# Patient Record
Sex: Male | Born: 1945 | Race: Black or African American | Hispanic: No
Health system: Southern US, Community
[De-identification: ages and names within clinical notes are randomized; demographics above are authoritative.]

## PROBLEM LIST (undated history)
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
## 2003-03-04 ENCOUNTER — Encounter: Payer: Self-pay | Admitting: Emergency Medicine

## 2003-03-04 ENCOUNTER — Emergency Department (HOSPITAL_COMMUNITY): Admission: EM | Admit: 2003-03-04 | Discharge: 2003-03-04 | Payer: Self-pay | Admitting: Emergency Medicine

## 2003-06-07 ENCOUNTER — Encounter: Payer: Self-pay | Admitting: Emergency Medicine

## 2003-06-07 ENCOUNTER — Emergency Department (HOSPITAL_COMMUNITY): Admission: EM | Admit: 2003-06-07 | Discharge: 2003-06-07 | Payer: Self-pay | Admitting: Emergency Medicine

## 2005-09-01 ENCOUNTER — Emergency Department (HOSPITAL_COMMUNITY): Admission: EM | Admit: 2005-09-01 | Discharge: 2005-09-01 | Payer: Self-pay | Admitting: Emergency Medicine

## 2020-10-18 ENCOUNTER — Emergency Department (HOSPITAL_COMMUNITY): Payer: Medicare Other

## 2020-10-18 ENCOUNTER — Inpatient Hospital Stay (HOSPITAL_COMMUNITY)
Admission: EM | Admit: 2020-10-18 | Discharge: 2020-11-11 | DRG: 208 | Disposition: E | Payer: Medicare Other | Attending: Surgery | Admitting: Surgery

## 2020-10-18 ENCOUNTER — Encounter (HOSPITAL_COMMUNITY): Payer: Self-pay | Admitting: Radiology

## 2020-10-18 DIAGNOSIS — T1490XA Injury, unspecified, initial encounter: Secondary | ICD-10-CM

## 2020-10-18 DIAGNOSIS — J939 Pneumothorax, unspecified: Secondary | ICD-10-CM

## 2020-10-18 DIAGNOSIS — Z20822 Contact with and (suspected) exposure to covid-19: Secondary | ICD-10-CM | POA: Diagnosis present

## 2020-10-18 DIAGNOSIS — N179 Acute kidney failure, unspecified: Secondary | ICD-10-CM | POA: Diagnosis not present

## 2020-10-18 DIAGNOSIS — Z66 Do not resuscitate: Secondary | ICD-10-CM | POA: Diagnosis not present

## 2020-10-18 DIAGNOSIS — N472 Paraphimosis: Secondary | ICD-10-CM | POA: Diagnosis present

## 2020-10-18 DIAGNOSIS — I10 Essential (primary) hypertension: Secondary | ICD-10-CM | POA: Diagnosis present

## 2020-10-18 DIAGNOSIS — S272XXA Traumatic hemopneumothorax, initial encounter: Secondary | ICD-10-CM | POA: Diagnosis present

## 2020-10-18 DIAGNOSIS — J9601 Acute respiratory failure with hypoxia: Secondary | ICD-10-CM | POA: Diagnosis not present

## 2020-10-18 DIAGNOSIS — S27321A Contusion of lung, unilateral, initial encounter: Secondary | ICD-10-CM | POA: Diagnosis present

## 2020-10-18 DIAGNOSIS — F10239 Alcohol dependence with withdrawal, unspecified: Secondary | ICD-10-CM | POA: Diagnosis present

## 2020-10-18 DIAGNOSIS — Y906 Blood alcohol level of 120-199 mg/100 ml: Secondary | ICD-10-CM | POA: Diagnosis present

## 2020-10-18 DIAGNOSIS — Z9689 Presence of other specified functional implants: Secondary | ICD-10-CM

## 2020-10-18 DIAGNOSIS — T8383XA Hemorrhage of genitourinary prosthetic devices, implants and grafts, initial encounter: Secondary | ICD-10-CM | POA: Diagnosis not present

## 2020-10-18 DIAGNOSIS — T797XXA Traumatic subcutaneous emphysema, initial encounter: Secondary | ICD-10-CM | POA: Diagnosis present

## 2020-10-18 DIAGNOSIS — R338 Other retention of urine: Secondary | ICD-10-CM | POA: Diagnosis present

## 2020-10-18 DIAGNOSIS — R579 Shock, unspecified: Secondary | ICD-10-CM | POA: Diagnosis not present

## 2020-10-18 DIAGNOSIS — I469 Cardiac arrest, cause unspecified: Secondary | ICD-10-CM | POA: Diagnosis not present

## 2020-10-18 DIAGNOSIS — Z0189 Encounter for other specified special examinations: Secondary | ICD-10-CM

## 2020-10-18 DIAGNOSIS — Z781 Physical restraint status: Secondary | ICD-10-CM

## 2020-10-18 DIAGNOSIS — S2242XA Multiple fractures of ribs, left side, initial encounter for closed fracture: Secondary | ICD-10-CM | POA: Diagnosis present

## 2020-10-18 DIAGNOSIS — N320 Bladder-neck obstruction: Secondary | ICD-10-CM | POA: Diagnosis present

## 2020-10-18 DIAGNOSIS — N401 Enlarged prostate with lower urinary tract symptoms: Secondary | ICD-10-CM | POA: Diagnosis present

## 2020-10-18 DIAGNOSIS — E876 Hypokalemia: Secondary | ICD-10-CM | POA: Diagnosis present

## 2020-10-18 DIAGNOSIS — R310 Gross hematuria: Secondary | ICD-10-CM | POA: Diagnosis not present

## 2020-10-18 DIAGNOSIS — R739 Hyperglycemia, unspecified: Secondary | ICD-10-CM | POA: Diagnosis present

## 2020-10-18 DIAGNOSIS — Y92410 Unspecified street and highway as the place of occurrence of the external cause: Secondary | ICD-10-CM | POA: Diagnosis not present

## 2020-10-18 DIAGNOSIS — S270XXA Traumatic pneumothorax, initial encounter: Secondary | ICD-10-CM

## 2020-10-18 DIAGNOSIS — R0902 Hypoxemia: Secondary | ICD-10-CM | POA: Diagnosis not present

## 2020-10-18 DIAGNOSIS — K567 Ileus, unspecified: Secondary | ICD-10-CM | POA: Diagnosis not present

## 2020-10-18 DIAGNOSIS — Y738 Miscellaneous gastroenterology and urology devices associated with adverse incidents, not elsewhere classified: Secondary | ICD-10-CM | POA: Diagnosis not present

## 2020-10-18 DIAGNOSIS — Z938 Other artificial opening status: Secondary | ICD-10-CM

## 2020-10-18 DIAGNOSIS — Z4682 Encounter for fitting and adjustment of non-vascular catheter: Secondary | ICD-10-CM

## 2020-10-18 LAB — URINALYSIS, ROUTINE W REFLEX MICROSCOPIC
Bilirubin Urine: NEGATIVE
Glucose, UA: NEGATIVE mg/dL
Ketones, ur: NEGATIVE mg/dL
Leukocytes,Ua: NEGATIVE
Nitrite: NEGATIVE
Protein, ur: NEGATIVE mg/dL
Specific Gravity, Urine: >1.046 — ABNORMAL HIGH (ref 1.005–1.030)
pH: 5 (ref 5.0–8.0)

## 2020-10-18 LAB — I-STAT CHEM 8, ED
BUN: 7 mg/dL — ABNORMAL LOW (ref 8–23)
Calcium, Ion: 1.07 mmol/L — ABNORMAL LOW (ref 1.15–1.40)
Chloride: 103 mmol/L (ref 98–111)
Creatinine, Ser: 1.5 mg/dL — ABNORMAL HIGH (ref 0.61–1.24)
Glucose, Bld: 147 mg/dL — ABNORMAL HIGH (ref 70–99)
HCT: 39 % (ref 39.0–52.0)
Hemoglobin: 13.3 g/dL (ref 13.0–17.0)
Potassium: 2.9 mmol/L — ABNORMAL LOW (ref 3.5–5.1)
Sodium: 141 mmol/L (ref 135–145)
TCO2: 21 mmol/L — ABNORMAL LOW (ref 22–32)

## 2020-10-18 LAB — RAPID URINE DRUG SCREEN, HOSP PERFORMED
Amphetamines: NOT DETECTED
Barbiturates: NOT DETECTED
Benzodiazepines: NOT DETECTED
Cocaine: NOT DETECTED
Opiates: NOT DETECTED
Tetrahydrocannabinol: POSITIVE — AB

## 2020-10-18 LAB — RESPIRATORY PANEL BY RT PCR (FLU A&B, COVID)
Influenza A by PCR: NEGATIVE
Influenza B by PCR: NEGATIVE
SARS Coronavirus 2 by RT PCR: NEGATIVE

## 2020-10-18 LAB — GLUCOSE, CAPILLARY: Glucose-Capillary: 136 mg/dL — ABNORMAL HIGH (ref 70–99)

## 2020-10-18 LAB — COMPREHENSIVE METABOLIC PANEL
ALT: 31 U/L (ref 0–44)
AST: 47 U/L — ABNORMAL HIGH (ref 15–41)
Albumin: 3.2 g/dL — ABNORMAL LOW (ref 3.5–5.0)
Alkaline Phosphatase: 46 U/L (ref 38–126)
Anion gap: 15 (ref 5–15)
BUN: 7 mg/dL — ABNORMAL LOW (ref 8–23)
CO2: 19 mmol/L — ABNORMAL LOW (ref 22–32)
Calcium: 8.3 mg/dL — ABNORMAL LOW (ref 8.9–10.3)
Chloride: 104 mmol/L (ref 98–111)
Creatinine, Ser: 1.24 mg/dL (ref 0.61–1.24)
GFR, Estimated: >60 mL/min (ref >60)
Glucose, Bld: 155 mg/dL — ABNORMAL HIGH (ref 70–99)
Potassium: 2.9 mmol/L — ABNORMAL LOW (ref 3.5–5.1)
Sodium: 138 mmol/L (ref 135–145)
Total Bilirubin: 0.6 mg/dL (ref 0.3–1.2)
Total Protein: 5.7 g/dL — ABNORMAL LOW (ref 6.5–8.1)

## 2020-10-18 LAB — CBC
HCT: 40.3 % (ref 39.0–52.0)
Hemoglobin: 13.2 g/dL (ref 13.0–17.0)
MCH: 31.4 pg (ref 26.0–34.0)
MCHC: 32.8 g/dL (ref 30.0–36.0)
MCV: 96 fL (ref 80.0–100.0)
Platelets: 281 10*3/uL (ref 150–400)
RBC: 4.2 MIL/uL — ABNORMAL LOW (ref 4.22–5.81)
RDW: 12.5 % (ref 11.5–15.5)
WBC: 9.2 10*3/uL (ref 4.0–10.5)
nRBC: 0 % (ref 0.0–0.2)

## 2020-10-18 LAB — PROTIME-INR
INR: 1.1 (ref 0.8–1.2)
Prothrombin Time: 13.7 seconds (ref 11.4–15.2)

## 2020-10-18 LAB — SAMPLE TO BLOOD BANK

## 2020-10-18 LAB — LACTIC ACID, PLASMA: Lactic Acid, Venous: 5.6 mmol/L (ref 0.5–1.9)

## 2020-10-18 LAB — ETHANOL: Alcohol, Ethyl (B): 124 mg/dL — ABNORMAL HIGH (ref <10)

## 2020-10-18 MED ORDER — IOHEXOL 300 MG/ML  SOLN
100.0000 mL | Freq: Once | INTRAMUSCULAR | Status: AC | PRN
  Administered 2020-10-18: 100 mL via INTRAVENOUS

## 2020-10-18 MED ORDER — THIAMINE HCL 100 MG/ML IJ SOLN
100.0000 mg | Freq: Every day | INTRAMUSCULAR | Status: DC
  Administered 2020-10-21: 100 mg via INTRAVENOUS
  Filled 2020-10-18: qty 2

## 2020-10-18 MED ORDER — INSULIN ASPART 100 UNIT/ML ~~LOC~~ SOLN: 0.0000 [IU] | Freq: Every day | SUBCUTANEOUS | Status: DC

## 2020-10-18 MED ORDER — LACTATED RINGERS IV BOLUS
1000.0000 mL | Freq: Once | INTRAVENOUS | Status: AC
  Administered 2020-10-18: 1000 mL via INTRAVENOUS

## 2020-10-18 MED ORDER — OXYCODONE HCL 5 MG PO TABS
5.0000 mg | ORAL_TABLET | Freq: Four times a day (QID) | ORAL | Status: DC | PRN
  Administered 2020-10-19 (×2): 10 mg via ORAL
  Filled 2020-10-18 (×2): qty 2

## 2020-10-18 MED ORDER — HYDROMORPHONE HCL 1 MG/ML IJ SOLN
0.5000 mg | INTRAMUSCULAR | Status: DC | PRN
  Administered 2020-10-18 – 2020-10-19 (×3): 0.5 mg via INTRAVENOUS
  Filled 2020-10-18 (×3): qty 1

## 2020-10-18 MED ORDER — LORAZEPAM 1 MG PO TABS
1.0000 mg | ORAL_TABLET | ORAL | Status: DC | PRN
  Administered 2020-10-20 (×2): 2 mg via ORAL
  Filled 2020-10-18 (×2): qty 2

## 2020-10-18 MED ORDER — POTASSIUM CHLORIDE 10 MEQ/100ML IV SOLN
10.0000 meq | Freq: Once | INTRAVENOUS | Status: AC
  Administered 2020-10-18: 10 meq via INTRAVENOUS
  Filled 2020-10-18: qty 100

## 2020-10-18 MED ORDER — ONDANSETRON HCL 4 MG/2ML IJ SOLN
4.0000 mg | Freq: Four times a day (QID) | INTRAMUSCULAR | Status: DC | PRN
  Administered 2020-10-22: 4 mg via INTRAVENOUS
  Filled 2020-10-18: qty 2

## 2020-10-18 MED ORDER — ADULT MULTIVITAMIN W/MINERALS CH
1.0000 | ORAL_TABLET | Freq: Every day | ORAL | Status: DC
  Administered 2020-10-18 – 2020-10-20 (×3): 1 via ORAL
  Filled 2020-10-18 (×3): qty 1

## 2020-10-18 MED ORDER — HYDRALAZINE HCL 20 MG/ML IJ SOLN
10.0000 mg | INTRAMUSCULAR | Status: DC | PRN
  Administered 2020-10-19: 10 mg via INTRAVENOUS
  Filled 2020-10-18: qty 1

## 2020-10-18 MED ORDER — ENOXAPARIN SODIUM 30 MG/0.3ML ~~LOC~~ SOLN
30.0000 mg | Freq: Two times a day (BID) | SUBCUTANEOUS | Status: DC
  Administered 2020-10-19 – 2020-10-25 (×13): 30 mg via SUBCUTANEOUS
  Filled 2020-10-18 (×13): qty 0.3

## 2020-10-18 MED ORDER — DOCUSATE SODIUM 100 MG PO CAPS
100.0000 mg | ORAL_CAPSULE | Freq: Two times a day (BID) | ORAL | Status: DC
  Administered 2020-10-18 – 2020-10-20 (×5): 100 mg via ORAL
  Filled 2020-10-18 (×5): qty 1

## 2020-10-18 MED ORDER — LORAZEPAM 2 MG/ML IJ SOLN
1.0000 mg | INTRAMUSCULAR | Status: DC | PRN
  Administered 2020-10-19: 2 mg via INTRAVENOUS
  Administered 2020-10-20: 1 mg via INTRAVENOUS
  Administered 2020-10-20 – 2020-10-21 (×2): 2 mg via INTRAVENOUS
  Administered 2020-10-21: 1 mg via INTRAVENOUS
  Administered 2020-10-21 (×2): 4 mg via INTRAVENOUS
  Filled 2020-10-18 (×2): qty 1
  Filled 2020-10-18: qty 2
  Filled 2020-10-18 (×2): qty 1
  Filled 2020-10-18: qty 2
  Filled 2020-10-18 (×3): qty 1

## 2020-10-18 MED ORDER — THIAMINE HCL 100 MG PO TABS
100.0000 mg | ORAL_TABLET | Freq: Every day | ORAL | Status: DC
  Administered 2020-10-18 – 2020-10-20 (×3): 100 mg via ORAL
  Filled 2020-10-18 (×4): qty 1

## 2020-10-18 MED ORDER — ACETAMINOPHEN 325 MG PO TABS
650.0000 mg | ORAL_TABLET | Freq: Four times a day (QID) | ORAL | Status: DC
  Administered 2020-10-18 – 2020-10-20 (×7): 650 mg via ORAL
  Filled 2020-10-18 (×7): qty 2

## 2020-10-18 MED ORDER — FOLIC ACID 1 MG PO TABS
1.0000 mg | ORAL_TABLET | Freq: Every day | ORAL | Status: DC
  Administered 2020-10-18 – 2020-10-21 (×4): 1 mg via ORAL
  Filled 2020-10-18 (×5): qty 1

## 2020-10-18 MED ORDER — FENTANYL CITRATE (PF) 100 MCG/2ML IJ SOLN
INTRAMUSCULAR | Status: AC
  Filled 2020-10-18: qty 2

## 2020-10-18 MED ORDER — IOHEXOL 350 MG/ML SOLN
80.0000 mL | Freq: Once | INTRAVENOUS | Status: AC | PRN
  Administered 2020-10-18: 80 mL via INTRAVENOUS

## 2020-10-18 MED ORDER — LACTATED RINGERS IV SOLN: INTRAVENOUS | Status: DC

## 2020-10-18 MED ORDER — INSULIN ASPART 100 UNIT/ML ~~LOC~~ SOLN: 0.0000 [IU] | Freq: Three times a day (TID) | SUBCUTANEOUS | Status: DC

## 2020-10-18 MED ORDER — FENTANYL CITRATE (PF) 100 MCG/2ML IJ SOLN
INTRAMUSCULAR | Status: AC | PRN
Start: 2020-10-18 — End: 2020-10-18
  Administered 2020-10-18: 100 ug via INTRAVENOUS

## 2020-10-18 MED ORDER — ONDANSETRON 4 MG PO TBDP: 4.0000 mg | ORAL_TABLET | Freq: Four times a day (QID) | ORAL | Status: DC | PRN

## 2020-10-18 NOTE — ED Notes (Signed)
Pt oxygen desaturates with any movement. Pt repositioned in bed with slow improvement in oxygen saturation up to 93% from 87%.
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
## 2020-10-18 NOTE — ED Notes (Signed)
To CT NOW 

## 2020-10-18 NOTE — ED Notes (Signed)
Pt rolled over onto L side. Pt placed onto his back in the stretcher. Chest tube assessed, no kinks or air leaks noted. Tube appears secure.
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
## 2020-10-18 NOTE — ED Notes (Signed)
TRN note: Pt requesting we call Eula Fried, friend at [phone]. Friend notified by this RN

## 2020-10-18 NOTE — ED Notes (Signed)
TRN note:  Called to ED by primary RN to evaluate increasing SQ air in L chest. L side of chest does appear to have increased in size since arrival.  Chest tube remains in place, all bandages intact, active bright red drainage in tubing, @ 70cc bright red blood in saraha drainage device.  Bubbles can be produced with movement of tubing, pt reports he can not cough d/t pain. VSS. GSC 15. Will continue to monitor.  Dr. Cliffton Asters notified of concerns, no further orders at this time.

## 2020-10-18 NOTE — Progress Notes (Signed)
CT Chest demonstrated numerous left sided rib fractures with associated significant left pneumothorax and extensive subQ emphysema  Discussed this with the patient. He is having increasing dyspnea  We discussed emergent placement of left sided chest tube. We discussed percutaneous access of left chest cavity, placement of pigtail catheter into the chest to re-expand lung. We reviewed rational. We discussed risks of injury to nerves, blood vessels, lung, heart, intra-abdominal organs, infectious complications, need for additional tubes to be placed. He agreed to proceed

## 2020-10-18 NOTE — ED Notes (Signed)
C-collar applied

## 2020-10-18 NOTE — ED Notes (Signed)
Attempted report x1. 

## 2020-10-18 NOTE — ED Notes (Signed)
Pt to CT with TRN  

## 2020-10-18 NOTE — ED Notes (Signed)
Md notified of worsening left pectoral edema

## 2020-10-18 NOTE — ED Notes (Signed)
L sided chest tube placed by Dr. Cliffton Asters. Blood return noted.

## 2020-10-18 NOTE — Procedures (Signed)
Pre-op Diagnosis:  Traumatic left pneumothorax Post-op Diagnosis:  Same Procedure:   Placement of pigtail chest tube, left Surgeon: Marin Olp, MD Anesthesia:  Local   Description of procedure:  The patient was supine on the stretcher. The patient was identified and a timeout was held. The left chest prepped with Chloraprep and draped in sterile fashion.  1% lidocaine was infiltrated in the skin and subcutaneous tissue. Left anterior axillary line just lateral and cephalad to the nipple, the rib spaces were found. The pleural space was then accessed just above the rib with an access needle aiming cephalad. Intrapleural location was confirmed by aspiration of air.  The guidewire was advanced and the needle was removed.  A small incision was made in the skin. The dilator passed over the wire into the pleural space.  The dilator was removed and the catheter was directed over the wire after placing the straightener.  The straightener was removed, forming the pigtail.  The tube was connected to 20 cm H2O suction and secured with 0 silk.  A sterile dressing was applied. He tolerated the procedure well.

## 2020-10-18 NOTE — ED Notes (Signed)
Friend at bedside, updated on plan of care.
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
## 2020-10-19 ENCOUNTER — Inpatient Hospital Stay (HOSPITAL_COMMUNITY): Payer: Medicare Other

## 2020-10-19 LAB — COMPREHENSIVE METABOLIC PANEL
ALT: 42 U/L (ref 0–44)
AST: 83 U/L — ABNORMAL HIGH (ref 15–41)
Albumin: 3.2 g/dL — ABNORMAL LOW (ref 3.5–5.0)
Alkaline Phosphatase: 46 U/L (ref 38–126)
Anion gap: 11 (ref 5–15)
BUN: 9 mg/dL (ref 8–23)
CO2: 23 mmol/L (ref 22–32)
Calcium: 8.6 mg/dL — ABNORMAL LOW (ref 8.9–10.3)
Chloride: 105 mmol/L (ref 98–111)
Creatinine, Ser: 1.1 mg/dL (ref 0.61–1.24)
GFR, Estimated: >60 mL/min (ref >60)
Glucose, Bld: 141 mg/dL — ABNORMAL HIGH (ref 70–99)
Potassium: 4.2 mmol/L (ref 3.5–5.1)
Sodium: 139 mmol/L (ref 135–145)
Total Bilirubin: 0.8 mg/dL (ref 0.3–1.2)
Total Protein: 6.2 g/dL — ABNORMAL LOW (ref 6.5–8.1)

## 2020-10-19 LAB — GLUCOSE, CAPILLARY
Glucose-Capillary: 126 mg/dL — ABNORMAL HIGH (ref 70–99)
Glucose-Capillary: 127 mg/dL — ABNORMAL HIGH (ref 70–99)
Glucose-Capillary: 134 mg/dL — ABNORMAL HIGH (ref 70–99)
Glucose-Capillary: 116 mg/dL — ABNORMAL HIGH (ref 70–99)
Glucose-Capillary: 119 mg/dL — ABNORMAL HIGH (ref 70–99)

## 2020-10-19 LAB — CBC
HCT: 39.5 % (ref 39.0–52.0)
Hemoglobin: 13.5 g/dL (ref 13.0–17.0)
MCH: 32 pg (ref 26.0–34.0)
MCHC: 34.2 g/dL (ref 30.0–36.0)
MCV: 93.6 fL (ref 80.0–100.0)
Platelets: 215 10*3/uL (ref 150–400)
RBC: 4.22 MIL/uL (ref 4.22–5.81)
RDW: 12.7 % (ref 11.5–15.5)
WBC: 10.1 10*3/uL (ref 4.0–10.5)
nRBC: 0 % (ref 0.0–0.2)

## 2020-10-19 LAB — TRAUMA TEG PANEL
CFF Max Amplitude: 19.9 mm (ref 15–32)
Citrated Kaolin (R): 5.7 min (ref 4.6–9.1)
Citrated Rapid TEG (MA): 59.3 mm (ref 52–70)
Lysis at 30 Minutes: 1.4 % (ref 0.0–2.6)

## 2020-10-19 LAB — PHOSPHORUS: Phosphorus: 3.6 mg/dL (ref 2.5–4.6)

## 2020-10-19 LAB — HEMOGLOBIN A1C
Hgb A1c MFr Bld: 5.5 % (ref 4.8–5.6)
Mean Plasma Glucose: 111.15 mg/dL

## 2020-10-19 LAB — MAGNESIUM: Magnesium: 1.7 mg/dL (ref 1.7–2.4)

## 2020-10-19 MED ORDER — METOPROLOL TARTRATE 50 MG PO TABS
50.0000 mg | ORAL_TABLET | Freq: Two times a day (BID) | ORAL | Status: DC
  Administered 2020-10-19 – 2020-10-20 (×3): 50 mg via ORAL
  Filled 2020-10-19 (×3): qty 1

## 2020-10-19 MED ORDER — TAMSULOSIN HCL 0.4 MG PO CAPS: 0.4000 mg | ORAL_CAPSULE | Freq: Every day | ORAL | Status: DC

## 2020-10-19 MED ORDER — TAMSULOSIN HCL 0.4 MG PO CAPS
0.4000 mg | ORAL_CAPSULE | Freq: Every day | ORAL | Status: DC
  Administered 2020-10-19 – 2020-10-20 (×2): 0.4 mg via ORAL
  Filled 2020-10-19 (×2): qty 1

## 2020-10-19 MED ORDER — HYDRALAZINE HCL 20 MG/ML IJ SOLN: 10.0000 mg | INTRAMUSCULAR | Status: DC | PRN

## 2020-10-19 MED ORDER — IPRATROPIUM-ALBUTEROL 0.5-2.5 (3) MG/3ML IN SOLN
3.0000 mL | Freq: Four times a day (QID) | RESPIRATORY_TRACT | Status: DC
  Administered 2020-10-19 – 2020-10-20 (×3): 3 mL via RESPIRATORY_TRACT
  Filled 2020-10-19 (×3): qty 3

## 2020-10-19 MED ORDER — SPIRITUS FRUMENTI
1.0000 | Freq: Two times a day (BID) | ORAL | Status: DC
  Administered 2020-10-19 – 2020-10-20 (×2): 1 via ORAL
  Filled 2020-10-19 (×4): qty 1

## 2020-10-19 NOTE — TOC CAGE-AID Note (Signed)
Transition of Care [facility]) - CAGE-AID Screening   Patient Details  Name: Ernest Lawrence MRN: [id number] Date of Birth: [birth year]  [facility]) CM/SW Contact:    Emeterio Reeve, [place] Phone Number: 10/19/2020, 1:52 PM   Clinical Narrative:  CSW met with pt at bedside. CSW introduced self and explained her role at the hospital.  Pt reports occasional substance use and marijuana use. Pt denied resources.   CAGE-AID Screening:    Have You Ever Felt You Ought to Cut Down on Your Drinking or Drug Use?: No Have People Annoyed You By Critizing Your Drinking Or Drug Use?: No Have You Felt Bad Or Guilty About Your Drinking Or Drug Use?: No Have You Ever Had a Drink or Used Drugs First Thing In The Morning to Steady Your Nerves or to Get Rid of a Hangover?: No CAGE-AID Score: 0  Substance Abuse Education Offered: Yes     Blima Ledger, [place] Social Worker [phone]
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
## 2020-10-20 ENCOUNTER — Inpatient Hospital Stay (HOSPITAL_COMMUNITY): Payer: Medicare Other

## 2020-10-20 ENCOUNTER — Other Ambulatory Visit: Payer: Self-pay

## 2020-10-20 LAB — BLOOD GAS, ARTERIAL
Acid-Base Excess: 2.6 mmol/L — ABNORMAL HIGH (ref 0.0–2.0)
Bicarbonate: 26.6 mmol/L (ref 20.0–28.0)
FIO2: 36
O2 Saturation: 93.4 %
Patient temperature: 37.8
pCO2 arterial: 42.4 mmHg (ref 32.0–48.0)
pH, Arterial: 7.418 (ref 7.350–7.450)
pO2, Arterial: 70.9 mmHg — ABNORMAL LOW (ref 83.0–108.0)

## 2020-10-20 LAB — CBC
HCT: 40.9 % (ref 39.0–52.0)
Hemoglobin: 13.8 g/dL (ref 13.0–17.0)
MCH: 31.2 pg (ref 26.0–34.0)
MCHC: 33.7 g/dL (ref 30.0–36.0)
MCV: 92.5 fL (ref 80.0–100.0)
Platelets: 188 10*3/uL (ref 150–400)
RBC: 4.42 MIL/uL (ref 4.22–5.81)
RDW: 12.7 % (ref 11.5–15.5)
WBC: 11.4 10*3/uL — ABNORMAL HIGH (ref 4.0–10.5)
nRBC: 0 % (ref 0.0–0.2)

## 2020-10-20 LAB — BASIC METABOLIC PANEL
Anion gap: 9 (ref 5–15)
BUN: 9 mg/dL (ref 8–23)
CO2: 26 mmol/L (ref 22–32)
Calcium: 9.2 mg/dL (ref 8.9–10.3)
Chloride: 106 mmol/L (ref 98–111)
Creatinine, Ser: 0.9 mg/dL (ref 0.61–1.24)
GFR, Estimated: >60 mL/min (ref >60)
Glucose, Bld: 136 mg/dL — ABNORMAL HIGH (ref 70–99)
Potassium: 3.9 mmol/L (ref 3.5–5.1)
Sodium: 141 mmol/L (ref 135–145)

## 2020-10-20 LAB — PSA: Prostatic Specific Antigen: 10.17 ng/mL — ABNORMAL HIGH (ref 0.00–4.00)

## 2020-10-20 LAB — GLUCOSE, CAPILLARY
Glucose-Capillary: 107 mg/dL — ABNORMAL HIGH (ref 70–99)
Glucose-Capillary: 108 mg/dL — ABNORMAL HIGH (ref 70–99)
Glucose-Capillary: 128 mg/dL — ABNORMAL HIGH (ref 70–99)
Glucose-Capillary: 128 mg/dL — ABNORMAL HIGH (ref 70–99)
Glucose-Capillary: 140 mg/dL — ABNORMAL HIGH (ref 70–99)

## 2020-10-20 LAB — TROPONIN I (HIGH SENSITIVITY): Troponin I (High Sensitivity): 48 ng/L — ABNORMAL HIGH (ref <18)

## 2020-10-20 LAB — AMMONIA: Ammonia: 39 umol/L — ABNORMAL HIGH (ref 9–35)

## 2020-10-20 MED ORDER — ALBUTEROL SULFATE (2.5 MG/3ML) 0.083% IN NEBU: 2.5000 mg | INHALATION_SOLUTION | RESPIRATORY_TRACT | Status: DC | PRN

## 2020-10-20 MED ORDER — METOPROLOL TARTRATE 50 MG PO TABS
100.0000 mg | ORAL_TABLET | Freq: Two times a day (BID) | ORAL | Status: DC
  Administered 2020-10-20: 100 mg via ORAL
  Filled 2020-10-20: qty 2

## 2020-10-20 MED ORDER — IPRATROPIUM-ALBUTEROL 0.5-2.5 (3) MG/3ML IN SOLN
3.0000 mL | Freq: Two times a day (BID) | RESPIRATORY_TRACT | Status: DC
  Administered 2020-10-20 – 2020-10-22 (×2): 3 mL via RESPIRATORY_TRACT
  Filled 2020-10-20 (×5): qty 3

## 2020-10-20 MED ORDER — LACTULOSE 10 GM/15ML PO SOLN
20.0000 g | Freq: Once | ORAL | Status: AC
  Administered 2020-10-20: 20 g via ORAL
  Filled 2020-10-20: qty 30

## 2020-10-20 MED ORDER — FENTANYL CITRATE (PF) 100 MCG/2ML IJ SOLN
25.0000 ug | Freq: Once | INTRAMUSCULAR | Status: AC
  Administered 2020-10-20: 25 ug via INTRAVENOUS

## 2020-10-20 MED ORDER — FENTANYL CITRATE (PF) 100 MCG/2ML IJ SOLN
50.0000 ug | Freq: Once | INTRAMUSCULAR | Status: AC
  Administered 2020-10-20: 50 ug via INTRAVENOUS
  Filled 2020-10-20: qty 2

## 2020-10-20 MED ORDER — POLYETHYLENE GLYCOL 3350 17 G PO PACK
17.0000 g | PACK | Freq: Every day | ORAL | Status: DC
  Administered 2020-10-20: 17 g via ORAL

## 2020-10-20 MED ORDER — IPRATROPIUM-ALBUTEROL 0.5-2.5 (3) MG/3ML IN SOLN
3.0000 mL | Freq: Three times a day (TID) | RESPIRATORY_TRACT | Status: DC
  Filled 2020-10-20: qty 3

## 2020-10-20 NOTE — Progress Notes (Addendum)
2312 - Upon entering the room the pt was attempting to exit the bed. The pt chewed through his R wrist restraint, removed his IV and chest tube. This RN applied vaseline gauze and occlusive dressing to chest tube site. Pts O2 sats are >95. This RN noted diminished breath sounds and crepitus in L upper lung. Pt appears to be in no distress. On call provider notified. Portable chest x-ray ordered. 80 ml of output noted from chest tube. Bilateral wrist restraints applied. Bilateral hand mittens applied. Will continue to monitor.

## 2020-10-20 NOTE — Progress Notes (Signed)
SLP Cancellation Note  Patient Details Name: Ernest Lawrence MRN: [id number] DOB: 1946/01/18   Cancelled eval:     Attempted cognitive evaluation earlier in the day.  Pt groggy, adamant in his refusal to participate despite various attempts.  Will continue efforts.  Ernest Spikes L. Samson Frederic, MA CCC/SLP Acute Rehabilitation Services Office number [phone] Pager [phone]      Ernest Lawrence 10/20/2020, 1:26 PM
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
## 2020-10-20 NOTE — Procedures (Signed)
Insertion of Chest Tube Procedure Note  Ernest Lawrence  [id number]  [birth year]  Date:10/20/20  Time:4:29 PM    Provider Performing: Adam Phenix   Procedure: Chest Tube Insertion [phone])  Indication(s) Hemothorax  Consent Risks of the procedure as well as the alternatives and risks of each were explained to the patient and/or caregiver.  Consent for the procedure was obtained and is signed in the bedside chart  Anesthesia Topical only with 1% lidocaine  75 mcg Fentanyl   Time Out Verified patient identification, verified procedure, site/side was marked, verified correct patient position, special equipment/implants available, medications/allergies/relevant history reviewed, required imaging and test results available.   Sterile Technique Maximal sterile technique including full sterile barrier drape, hand hygiene, sterile gown, sterile gloves, mask, hair covering, sterile ultrasound probe cover (if used).   Procedure Description Ultrasound not used to identify appropriate pleural anatomy for placement and overlying skin marked. Area of placement cleaned and draped in sterile fashion.  A 14 French pigtail pleural catheter was placed into the left pleural space using Seldinger technique. Appropriate return of fluid was obtained.  The tube was connected to atrium and placed on -20 cm H2O wall suction. The previous chest tube site was closed with one simple interrupted suture.  Complications/Tolerance None; patient tolerated the procedure well. Chest X-ray is ordered to verify placement.  EBL Minimal  Specimen(s) none   Ernest Lawrence, [facility] General & Trauma Surgery
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
## 2020-10-20 NOTE — Progress Notes (Signed)
This RN noticed that monitor noted STE. STEMI noted on 12 L EKG. Pt does not complain of chest pain, pt SOB and diaphoretic. Abnormal breath and cardiac sounds noted. Rapid response and on call provider notified. On call provider ordered a troponin and blood gas.

## 2020-10-20 NOTE — Significant Event (Signed)
Rapid Response Event Note   Reason for Call :  STEMI on 12 L EKG.  RN noticed monitor saying "STE" and so an EKG was obtained. EKG showed "ST, ST elevation, consider inferolateral injury or acute infarct ACUTE MI/STEMI." Additional EKG was obtained showing "NSR, possible acute pericarditis."  Initial Focused Assessment:  Pt laying in bed, diaphoretic, SOB. Pt c/o L sided chest pain around his chest tube site. Crackles felt over L chest wall. R lung with rhonchi. L lung with rhonchi/rales t/o. There is what sounds like a pleural friction rub on the L side and possibly a pericardial friction rub as well-this is not very loud and it's hard to hear over lung sounds. Skin warm and diaphoretic. L CT to -40 sx with no air leak, draining serosang drainage. HR-99, BP-180/97, RR-43, SpO2-93% on 4L [place].    Interventions:  EKG-see above for interpretation Trop ABG Plan of Care:  Per RN, SOB is not new. Obtain trop, ABG. Await results. Continue to monitor pt. Call RRT if further assistance needed.   Event Summary:   MD Notified: Dr. Bedelia Person at [phone] Call Time:0032 Arrival Time:0039 End Time:0103  Terrilyn Saver, RN
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
## 2020-10-21 ENCOUNTER — Inpatient Hospital Stay (HOSPITAL_COMMUNITY): Payer: Medicare Other

## 2020-10-21 LAB — BASIC METABOLIC PANEL
Anion gap: 10 (ref 5–15)
BUN: 15 mg/dL (ref 8–23)
CO2: 28 mmol/L (ref 22–32)
Calcium: 9.6 mg/dL (ref 8.9–10.3)
Chloride: 105 mmol/L (ref 98–111)
Creatinine, Ser: 1.06 mg/dL (ref 0.61–1.24)
GFR, Estimated: >60 mL/min (ref >60)
Glucose, Bld: 132 mg/dL — ABNORMAL HIGH (ref 70–99)
Potassium: 3.8 mmol/L (ref 3.5–5.1)
Sodium: 143 mmol/L (ref 135–145)

## 2020-10-21 LAB — BLOOD GAS, ARTERIAL
Acid-Base Excess: 4.4 mmol/L — ABNORMAL HIGH (ref 0.0–2.0)
Bicarbonate: 28.1 mmol/L — ABNORMAL HIGH (ref 20.0–28.0)
Drawn by: 519031
FIO2: 40
O2 Saturation: 94 %
Patient temperature: 37.5
pCO2 arterial: 40.5 mmHg (ref 32.0–48.0)
pH, Arterial: 7.457 — ABNORMAL HIGH (ref 7.350–7.450)
pO2, Arterial: 70.9 mmHg — ABNORMAL LOW (ref 83.0–108.0)

## 2020-10-21 LAB — CBC
HCT: 41 % (ref 39.0–52.0)
Hemoglobin: 14 g/dL (ref 13.0–17.0)
MCH: 32 pg (ref 26.0–34.0)
MCHC: 34.1 g/dL (ref 30.0–36.0)
MCV: 93.6 fL (ref 80.0–100.0)
Platelets: 223 10*3/uL (ref 150–400)
RBC: 4.38 MIL/uL (ref 4.22–5.81)
RDW: 12.7 % (ref 11.5–15.5)
WBC: 10.6 10*3/uL — ABNORMAL HIGH (ref 4.0–10.5)
nRBC: 0 % (ref 0.0–0.2)

## 2020-10-21 LAB — GLUCOSE, CAPILLARY
Glucose-Capillary: 115 mg/dL — ABNORMAL HIGH (ref 70–99)
Glucose-Capillary: 124 mg/dL — ABNORMAL HIGH (ref 70–99)
Glucose-Capillary: 111 mg/dL — ABNORMAL HIGH (ref 70–99)

## 2020-10-21 LAB — AMMONIA: Ammonia: 37 umol/L — ABNORMAL HIGH (ref 9–35)

## 2020-10-21 MED ORDER — LACTULOSE 10 GM/15ML PO SOLN
20.0000 g | Freq: Once | ORAL | Status: AC
  Administered 2020-10-21: 20 g
  Filled 2020-10-21: qty 30

## 2020-10-21 MED ORDER — POTASSIUM CHLORIDE IN NACL 20-0.45 MEQ/L-% IV SOLN
INTRAVENOUS | Status: DC
  Filled 2020-10-21 (×7): qty 1000

## 2020-10-21 MED ORDER — PANTOPRAZOLE SODIUM 40 MG IV SOLR
40.0000 mg | Freq: Once | INTRAVENOUS | Status: AC
  Administered 2020-10-21: 40 mg via INTRAVENOUS
  Filled 2020-10-21: qty 40

## 2020-10-21 MED ORDER — CHLORHEXIDINE GLUCONATE CLOTH 2 % EX PADS
6.0000 | MEDICATED_PAD | Freq: Every day | CUTANEOUS | Status: DC
  Administered 2020-10-21 – 2020-10-25 (×5): 6 via TOPICAL

## 2020-10-21 MED ORDER — METOPROLOL TARTRATE 5 MG/5ML IV SOLN
5.0000 mg | Freq: Three times a day (TID) | INTRAVENOUS | Status: DC
  Administered 2020-10-21 – 2020-10-22 (×3): 5 mg via INTRAVENOUS
  Filled 2020-10-21 (×3): qty 5

## 2020-10-21 MED ORDER — MORPHINE SULFATE (PF) 2 MG/ML IV SOLN
2.0000 mg | INTRAVENOUS | Status: DC | PRN
  Administered 2020-10-21 – 2020-10-23 (×5): 2 mg via INTRAVENOUS
  Filled 2020-10-21 (×5): qty 1

## 2020-10-21 MED ORDER — BETHANECHOL CHLORIDE 25 MG PO TABS
25.0000 mg | ORAL_TABLET | Freq: Three times a day (TID) | ORAL | Status: DC
  Administered 2020-10-21 – 2020-10-23 (×8): 25 mg
  Filled 2020-10-21 (×8): qty 1

## 2020-10-21 MED ORDER — LORAZEPAM 2 MG/ML IJ SOLN
1.0000 mg | INTRAMUSCULAR | Status: DC | PRN
  Administered 2020-10-21 – 2020-10-22 (×5): 2 mg via INTRAVENOUS
  Filled 2020-10-21 (×5): qty 1

## 2020-10-21 MED ORDER — FUROSEMIDE 10 MG/ML IJ SOLN
40.0000 mg | Freq: Once | INTRAMUSCULAR | Status: AC
  Administered 2020-10-21: 40 mg via INTRAVENOUS
  Filled 2020-10-21: qty 4

## 2020-10-21 MED ORDER — LORAZEPAM 1 MG PO TABS: 1.0000 mg | ORAL_TABLET | ORAL | Status: DC | PRN

## 2020-10-21 MED ORDER — HALOPERIDOL LACTATE 5 MG/ML IJ SOLN: 5.0000 mg | Freq: Four times a day (QID) | INTRAMUSCULAR | Status: DC | PRN

## 2020-10-21 MED ORDER — LIDOCAINE HCL (PF) 1 % IJ SOLN
INTRAMUSCULAR | Status: AC
  Filled 2020-10-21: qty 5

## 2020-10-21 MED ORDER — LIDOCAINE HCL (PF) 1 % IJ SOLN
INTRAMUSCULAR | Status: AC
  Filled 2020-10-21: qty 30
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
## 2020-10-21 NOTE — Progress Notes (Signed)
RT NOTES: ABG obtained and sent to lab. Lab tech notified.  
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
## 2020-10-21 NOTE — Procedures (Signed)
Insertion of Chest Tube Procedure Note  Ernest Lawrence  [id number]  [birth year]  Date:10/21/20  Time:9:31 AM    Provider Performing: Ernest Mouton, MD  Procedure: Chest Tube Insertion (25053)  Indication(s) Pneumothorax  Consent Unable to obtain consent due to emergent nature of procedure.  Anesthesia Topical only with 1% lidocaine    Time Out Verified patient identification, verified procedure, site/side was marked, verified correct patient position, special equipment/implants available, medications/allergies/relevant history reviewed, required imaging and test results available.   Sterile Technique Maximal sterile technique including full sterile barrier drape, hand hygiene, sterile gown, sterile gloves, mask, hair covering, sterile ultrasound probe cover (if used).   Procedure Description Ultrasound not used to identify appropriate pleural anatomy for placement and overlying skin marked. Area of placement cleaned and draped in sterile fashion.  A 20 French chest tube was placed into the left pleural space using Ernest Lawrence dissection. Appropriate return of air and fluid was obtained.  The tube was connected to atrium and placed on -20 cm H2O wall suction.   Complications/Tolerance None; patient tolerated the procedure well. Chest X-ray is ordered to verify placement.   EBL Minimal  Specimen(s) none   Ernest Lawrence, [facility] Surgery 10/21/2020, 9:32 AM Please see Amion for pager number during day hours 7:00am-4:30pm

## 2020-10-21 NOTE — Progress Notes (Signed)
PT Cancellation Note  Patient Details Name: Ernest Lawrence MRN: [id number] DOB: 07/08/1946   Cancelled Treatment:    Reason Eval/Treat Not Completed: Other (comment). Pt very agitated now in restraints and mittens. Pt pulled out chest tube. Pt just had another chest tube placed. Per PA they pulled 600cc out of NG tube and pt now has an ileus. Acute PT to return as able as appropriate.  Lewis Shock, PT, DPT Acute Rehabilitation Services Pager #: [phone] Office #: [phone]    Iona Hansen 10/21/2020, 10:22 AM
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
## 2020-10-22 ENCOUNTER — Inpatient Hospital Stay (HOSPITAL_COMMUNITY): Payer: Medicare Other

## 2020-10-22 LAB — BASIC METABOLIC PANEL
Anion gap: 10 (ref 5–15)
BUN: 27 mg/dL — ABNORMAL HIGH (ref 8–23)
CO2: 28 mmol/L (ref 22–32)
Calcium: 8.7 mg/dL — ABNORMAL LOW (ref 8.9–10.3)
Chloride: 107 mmol/L (ref 98–111)
Creatinine, Ser: 1.28 mg/dL — ABNORMAL HIGH (ref 0.61–1.24)
GFR, Estimated: 60 mL/min — ABNORMAL LOW (ref >60)
Glucose, Bld: 128 mg/dL — ABNORMAL HIGH (ref 70–99)
Potassium: 3.8 mmol/L (ref 3.5–5.1)
Sodium: 145 mmol/L (ref 135–145)

## 2020-10-22 LAB — CBC
HCT: 38.4 % — ABNORMAL LOW (ref 39.0–52.0)
Hemoglobin: 13 g/dL (ref 13.0–17.0)
MCH: 31.6 pg (ref 26.0–34.0)
MCHC: 33.9 g/dL (ref 30.0–36.0)
MCV: 93.4 fL (ref 80.0–100.0)
Platelets: 224 10*3/uL (ref 150–400)
RBC: 4.11 MIL/uL — ABNORMAL LOW (ref 4.22–5.81)
RDW: 13.1 % (ref 11.5–15.5)
WBC: 7.5 10*3/uL (ref 4.0–10.5)
nRBC: 0 % (ref 0.0–0.2)

## 2020-10-22 LAB — GLUCOSE, CAPILLARY
Glucose-Capillary: 108 mg/dL — ABNORMAL HIGH (ref 70–99)
Glucose-Capillary: 117 mg/dL — ABNORMAL HIGH (ref 70–99)
Glucose-Capillary: 128 mg/dL — ABNORMAL HIGH (ref 70–99)
Glucose-Capillary: 106 mg/dL — ABNORMAL HIGH (ref 70–99)
Glucose-Capillary: 128 mg/dL — ABNORMAL HIGH (ref 70–99)

## 2020-10-22 MED ORDER — GUAIFENESIN 100 MG/5ML PO SOLN
5.0000 mL | ORAL | Status: DC
  Administered 2020-10-22 – 2020-10-24 (×14): 100 mg
  Filled 2020-10-22 (×10): qty 10

## 2020-10-22 MED ORDER — GUAIFENESIN 100 MG/5ML PO SOLN
5.0000 mL | ORAL | Status: DC
  Filled 2020-10-22: qty 10

## 2020-10-22 MED ORDER — METOPROLOL TARTRATE 25 MG/10 ML ORAL SUSPENSION
50.0000 mg | Freq: Two times a day (BID) | ORAL | Status: DC
  Filled 2020-10-22: qty 20

## 2020-10-22 MED ORDER — ACETAMINOPHEN 160 MG/5ML PO SOLN
650.0000 mg | Freq: Four times a day (QID) | ORAL | Status: DC | PRN
  Administered 2020-10-22: 650 mg

## 2020-10-22 MED ORDER — ONDANSETRON HCL 4 MG PO TABS: 4.0000 mg | ORAL_TABLET | Freq: Four times a day (QID) | ORAL | Status: DC | PRN

## 2020-10-22 MED ORDER — PROSOURCE TF PO LIQD
45.0000 mL | Freq: Three times a day (TID) | ORAL | Status: DC
  Administered 2020-10-22 – 2020-10-23 (×5): 45 mL
  Filled 2020-10-22 (×4): qty 45

## 2020-10-22 MED ORDER — FOLIC ACID 1 MG PO TABS
1.0000 mg | ORAL_TABLET | Freq: Every day | ORAL | Status: DC
  Administered 2020-10-22 – 2020-10-24 (×3): 1 mg
  Filled 2020-10-22 (×2): qty 1

## 2020-10-22 MED ORDER — METOPROLOL TARTRATE 25 MG/10 ML ORAL SUSPENSION
50.0000 mg | Freq: Two times a day (BID) | ORAL | Status: DC
  Administered 2020-10-22 – 2020-10-24 (×5): 50 mg
  Filled 2020-10-22 (×4): qty 20

## 2020-10-22 MED ORDER — LORAZEPAM 2 MG/ML IJ SOLN
1.0000 mg | INTRAMUSCULAR | Status: AC | PRN
  Administered 2020-10-22 – 2020-10-23 (×4): 2 mg via INTRAVENOUS
  Administered 2020-10-23: 1 mg via INTRAVENOUS
  Administered 2020-10-23 – 2020-10-24 (×2): 2 mg via INTRAVENOUS
  Filled 2020-10-22 (×7): qty 1

## 2020-10-22 MED ORDER — ACETAMINOPHEN 160 MG/5ML PO SOLN
650.0000 mg | Freq: Four times a day (QID) | ORAL | Status: DC | PRN
  Filled 2020-10-22: qty 20.3

## 2020-10-22 MED ORDER — LORAZEPAM 1 MG PO TABS
1.0000 mg | ORAL_TABLET | ORAL | Status: AC | PRN
  Filled 2020-10-22: qty 2

## 2020-10-22 MED ORDER — THIAMINE HCL 100 MG PO TABS
100.0000 mg | ORAL_TABLET | Freq: Every day | ORAL | Status: DC
  Administered 2020-10-22 – 2020-10-24 (×3): 100 mg
  Filled 2020-10-22 (×2): qty 1

## 2020-10-22 MED ORDER — OSMOLITE 1.5 CAL PO LIQD
1000.0000 mL | ORAL | Status: DC
  Administered 2020-10-22 – 2020-10-23 (×3): 1000 mL
  Filled 2020-10-22: qty 1000

## 2020-10-22 MED ORDER — OXYCODONE HCL 5 MG/5ML PO SOLN
5.0000 mg | ORAL | Status: DC | PRN
  Administered 2020-10-22 – 2020-10-23 (×2): 10 mg
  Administered 2020-10-23: 5 mg
  Filled 2020-10-22 (×2): qty 10
  Filled 2020-10-22: qty 5

## 2020-10-22 MED ORDER — THIAMINE HCL 100 MG/ML IJ SOLN: 100.0000 mg | Freq: Every day | INTRAMUSCULAR | Status: DC

## 2020-10-22 MED ORDER — OXYCODONE HCL 5 MG/5ML PO SOLN
5.0000 mg | ORAL | Status: DC | PRN
  Filled 2020-10-22: qty 10

## 2020-10-22 MED ORDER — OSMOLITE 1.2 CAL PO LIQD: 1000.0000 mL | ORAL | Status: DC

## 2020-10-22 NOTE — Progress Notes (Signed)
OT Cancellation Note  Patient Details Name: Ernest Lawrence MRN: [id number] DOB: 1946-03-13   Cancelled Treatment:    Reason Eval/Treat Not Completed: Patient's level of consciousness;Patient not medically ready Per RN, pt requiring increased amounts of Ativan to remain calm and in bed, remains in four point restraints, confused, agitated, and unable to follow commands. Will re-attempt as more medically appropriate.   Pollyann Glen E. Zari Cly, COTA/L Acute Rehabilitation Services [phone]  Cherlyn Cushing 10/22/2020, 10:09 AM

## 2020-10-22 NOTE — Progress Notes (Signed)
PT Cancellation Note  Patient Details Name: Ernest Lawrence MRN: [id number] DOB: Jun 03, 1946   Cancelled Treatment:    Reason Eval/Treat Not Completed: Patient's level of consciousness;Patient not medically ready;Other (comment) Per RN, pt requiring increased amounts of Ativan to remain calm and in bed, remains in four point restraints, confused, agitated, and unable to follow commands. PT will continue to follow and treat as appropriate.   Deland Pretty, DPT   Acute Rehabilitation Department Pager #: [phone]   Gaetana Michaelis 10/22/2020, 10:57 AM
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
## 2020-10-22 NOTE — Care Management Important Message (Signed)
Important Message  Patient Details  Name: Ernest Lawrence MRN: [id number] Date of Birth: 06-Jun-1946   Medicare Important Message Given:  Yes     Dorena Bodo 10/22/2020, 3:01 PM
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
## 2020-10-23 ENCOUNTER — Inpatient Hospital Stay (HOSPITAL_COMMUNITY): Payer: Medicare Other

## 2020-10-23 LAB — BASIC METABOLIC PANEL
Anion gap: 8 (ref 5–15)
BUN: 15 mg/dL (ref 8–23)
CO2: 28 mmol/L (ref 22–32)
Calcium: 8.2 mg/dL — ABNORMAL LOW (ref 8.9–10.3)
Chloride: 110 mmol/L (ref 98–111)
Creatinine, Ser: 0.87 mg/dL (ref 0.61–1.24)
GFR, Estimated: >60 mL/min (ref >60)
Glucose, Bld: 138 mg/dL — ABNORMAL HIGH (ref 70–99)
Potassium: 3.9 mmol/L (ref 3.5–5.1)
Sodium: 146 mmol/L — ABNORMAL HIGH (ref 135–145)

## 2020-10-23 LAB — GLUCOSE, CAPILLARY
Glucose-Capillary: 116 mg/dL — ABNORMAL HIGH (ref 70–99)
Glucose-Capillary: 118 mg/dL — ABNORMAL HIGH (ref 70–99)
Glucose-Capillary: 124 mg/dL — ABNORMAL HIGH (ref 70–99)
Glucose-Capillary: 127 mg/dL — ABNORMAL HIGH (ref 70–99)
Glucose-Capillary: 131 mg/dL — ABNORMAL HIGH (ref 70–99)
Glucose-Capillary: 135 mg/dL — ABNORMAL HIGH (ref 70–99)
Glucose-Capillary: 136 mg/dL — ABNORMAL HIGH (ref 70–99)

## 2020-10-23 MED ORDER — LACTULOSE 10 GM/15ML PO SOLN
30.0000 g | Freq: Once | ORAL | Status: DC
  Filled 2020-10-23: qty 45

## 2020-10-23 MED ORDER — LACTULOSE 10 GM/15ML PO SOLN
30.0000 g | Freq: Once | ORAL | Status: AC
  Administered 2020-10-23: 30 g

## 2020-10-23 MED ORDER — DOCUSATE SODIUM 50 MG/5ML PO LIQD
50.0000 mg | Freq: Two times a day (BID) | ORAL | Status: DC
  Administered 2020-10-23 – 2020-10-24 (×3): 50 mg
  Filled 2020-10-23 (×3): qty 10
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
## 2020-10-23 NOTE — Progress Notes (Signed)
Per order patient chest tube changed to water seal. O2 decreased to 2L [place] patient maintaining 97-98%. Will continue with plan of care.
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
## 2020-10-23 NOTE — Progress Notes (Deleted)
This nurse observed patient's ability to ambulate to bathroom and around room without assistance to get out of bed.

## 2020-10-23 NOTE — Progress Notes (Signed)
RT NOTES: Patient refusing to use flutter valve this morning. Will check back later.

## 2020-10-23 NOTE — Progress Notes (Signed)
Weaned patient off. O2 patient maintaining O2 sat at 91-92% room air.
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
## 2020-10-23 NOTE — Progress Notes (Signed)
Patient's chest tube out. Noted him holding it during my shift  assessment. Vaseline  Gauze applied and topped it with regular gauze and taped it. 02 sat is sustaining in the 93% on 2 L. Very lethargy/drowsy. Notified Dr. Andrey Campanile and received a new order for CXR. Will continue to monitor.
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
## 2020-10-24 ENCOUNTER — Inpatient Hospital Stay (HOSPITAL_COMMUNITY): Payer: Medicare Other

## 2020-10-24 LAB — GLUCOSE, CAPILLARY
Glucose-Capillary: 98 mg/dL (ref 70–99)
Glucose-Capillary: 115 mg/dL — ABNORMAL HIGH (ref 70–99)
Glucose-Capillary: 179 mg/dL — ABNORMAL HIGH (ref 70–99)
Glucose-Capillary: 98 mg/dL (ref 70–99)

## 2020-10-24 MED ORDER — LACTULOSE 10 GM/15ML PO SOLN
30.0000 g | Freq: Every day | ORAL | Status: DC
  Administered 2020-10-24: 30 g
  Filled 2020-10-24: qty 45

## 2020-10-24 MED ORDER — FOLIC ACID 1 MG PO TABS
1.0000 mg | ORAL_TABLET | Freq: Every day | ORAL | Status: DC
  Administered 2020-10-25: 1 mg via ORAL
  Filled 2020-10-24: qty 1

## 2020-10-24 MED ORDER — ONDANSETRON HCL 4 MG PO TABS: 4.0000 mg | ORAL_TABLET | Freq: Four times a day (QID) | ORAL | Status: DC | PRN

## 2020-10-24 MED ORDER — TAMSULOSIN HCL 0.4 MG PO CAPS
0.4000 mg | ORAL_CAPSULE | Freq: Every day | ORAL | Status: DC
  Administered 2020-10-24 – 2020-10-25 (×2): 0.4 mg via ORAL
  Filled 2020-10-24 (×2): qty 1

## 2020-10-24 MED ORDER — ACETAMINOPHEN 325 MG PO TABS: 650.0000 mg | ORAL_TABLET | Freq: Four times a day (QID) | ORAL | Status: DC

## 2020-10-24 MED ORDER — OXYCODONE HCL 5 MG PO TABS
5.0000 mg | ORAL_TABLET | ORAL | Status: DC | PRN
  Administered 2020-10-25: 10 mg via ORAL
  Filled 2020-10-24: qty 2

## 2020-10-24 MED ORDER — DOCUSATE SODIUM 50 MG/5ML PO LIQD
50.0000 mg | Freq: Two times a day (BID) | ORAL | Status: DC
  Administered 2020-10-24 – 2020-10-25 (×2): 50 mg via ORAL
  Filled 2020-10-24 (×2): qty 10

## 2020-10-24 MED ORDER — THIAMINE HCL 100 MG/ML IJ SOLN: 100.0000 mg | Freq: Every day | INTRAMUSCULAR | Status: DC

## 2020-10-24 MED ORDER — OXYCODONE HCL 5 MG PO TABS: 5.0000 mg | ORAL_TABLET | ORAL | Status: DC | PRN

## 2020-10-24 MED ORDER — METOPROLOL TARTRATE 50 MG PO TABS
50.0000 mg | ORAL_TABLET | Freq: Two times a day (BID) | ORAL | Status: DC
  Administered 2020-10-24 – 2020-10-25 (×2): 50 mg via ORAL
  Filled 2020-10-24 (×2): qty 1

## 2020-10-24 MED ORDER — LACTULOSE 10 GM/15ML PO SOLN: 30.0000 g | Freq: Every day | ORAL | Status: DC

## 2020-10-24 MED ORDER — GUAIFENESIN 100 MG/5ML PO SOLN
5.0000 mL | ORAL | Status: DC
  Administered 2020-10-24 – 2020-10-25 (×2): 100 mg via ORAL
  Filled 2020-10-24 (×3): qty 10

## 2020-10-24 MED ORDER — ACETAMINOPHEN 325 MG PO TABS
650.0000 mg | ORAL_TABLET | Freq: Four times a day (QID) | ORAL | Status: DC
  Administered 2020-10-24: 650 mg

## 2020-10-24 MED ORDER — ACETAMINOPHEN 325 MG PO TABS
650.0000 mg | ORAL_TABLET | Freq: Four times a day (QID) | ORAL | Status: DC
  Administered 2020-10-24 – 2020-10-25 (×3): 650 mg via ORAL
  Filled 2020-10-24 (×3): qty 2

## 2020-10-24 MED ORDER — THIAMINE HCL 100 MG PO TABS
100.0000 mg | ORAL_TABLET | Freq: Every day | ORAL | Status: DC
  Administered 2020-10-25: 100 mg via ORAL
  Filled 2020-10-24: qty 1

## 2020-10-24 NOTE — Progress Notes (Signed)
Received written report from portable abd. X-ray which reveals possible ileus.
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
## 2020-10-24 NOTE — Progress Notes (Signed)
Patient pulled his NGT out. Reinserted and ordered X-ray for verification. Paged Dr. Bedelia Person on call and awaiting a call back.

## 2020-10-25 ENCOUNTER — Inpatient Hospital Stay (HOSPITAL_COMMUNITY): Payer: Medicare Other

## 2020-10-25 DIAGNOSIS — I469 Cardiac arrest, cause unspecified: Secondary | ICD-10-CM

## 2020-10-25 LAB — TYPE AND SCREEN
ABO/RH(D): B POS
Antibody Screen: NEGATIVE

## 2020-10-25 LAB — GLUCOSE, CAPILLARY
Glucose-Capillary: 87 mg/dL (ref 70–99)
Glucose-Capillary: 85 mg/dL (ref 70–99)
Glucose-Capillary: 93 mg/dL (ref 70–99)

## 2020-10-25 LAB — CBC
HCT: 34.4 % — ABNORMAL LOW (ref 39.0–52.0)
HCT: 35.7 % — ABNORMAL LOW (ref 39.0–52.0)
Hemoglobin: 11.4 g/dL — ABNORMAL LOW (ref 13.0–17.0)
Hemoglobin: 11.4 g/dL — ABNORMAL LOW (ref 13.0–17.0)
MCH: 31.4 pg (ref 26.0–34.0)
MCH: 31.5 pg (ref 26.0–34.0)
MCHC: 33.1 g/dL (ref 30.0–36.0)
MCHC: 31.9 g/dL (ref 30.0–36.0)
MCV: 94.8 fL (ref 80.0–100.0)
MCV: 98.6 fL (ref 80.0–100.0)
Platelets: 279 10*3/uL (ref 150–400)
Platelets: 232 10*3/uL (ref 150–400)
RBC: 3.63 MIL/uL — ABNORMAL LOW (ref 4.22–5.81)
RBC: 3.62 MIL/uL — ABNORMAL LOW (ref 4.22–5.81)
RDW: 13 % (ref 11.5–15.5)
RDW: 13.1 % (ref 11.5–15.5)
WBC: 8.9 10*3/uL (ref 4.0–10.5)
WBC: 1.9 10*3/uL — ABNORMAL LOW (ref 4.0–10.5)
nRBC: 0 % (ref 0.0–0.2)
nRBC: 1.6 % — ABNORMAL HIGH (ref 0.0–0.2)

## 2020-10-25 LAB — BASIC METABOLIC PANEL
Anion gap: 9 (ref 5–15)
Anion gap: 15 (ref 5–15)
BUN: 9 mg/dL (ref 8–23)
BUN: 12 mg/dL (ref 8–23)
CO2: 28 mmol/L (ref 22–32)
CO2: 22 mmol/L (ref 22–32)
Calcium: 8.2 mg/dL — ABNORMAL LOW (ref 8.9–10.3)
Calcium: 7.8 mg/dL — ABNORMAL LOW (ref 8.9–10.3)
Chloride: 103 mmol/L (ref 98–111)
Chloride: 103 mmol/L (ref 98–111)
Creatinine, Ser: 0.77 mg/dL (ref 0.61–1.24)
Creatinine, Ser: 1.23 mg/dL (ref 0.61–1.24)
GFR, Estimated: >60 mL/min (ref >60)
GFR, Estimated: >60 mL/min (ref >60)
Glucose, Bld: 103 mg/dL — ABNORMAL HIGH (ref 70–99)
Glucose, Bld: 171 mg/dL — ABNORMAL HIGH (ref 70–99)
Potassium: 3.3 mmol/L — ABNORMAL LOW (ref 3.5–5.1)
Potassium: 3.8 mmol/L (ref 3.5–5.1)
Sodium: 140 mmol/L (ref 135–145)
Sodium: 140 mmol/L (ref 135–145)

## 2020-10-25 LAB — ABO/RH: ABO/RH(D): B POS

## 2020-10-25 LAB — AMMONIA: Ammonia: 45 umol/L — ABNORMAL HIGH (ref 9–35)

## 2020-10-25 LAB — LACTIC ACID, PLASMA: Lactic Acid, Venous: 7 mmol/L (ref 0.5–1.9)

## 2020-10-25 LAB — POCT I-STAT 7, (LYTES, BLD GAS, ICA,H+H)
Acid-Base Excess: 1 mmol/L (ref 0.0–2.0)
Bicarbonate: 27.8 mmol/L (ref 20.0–28.0)
Calcium, Ion: 1.28 mmol/L (ref 1.15–1.40)
HCT: 29 % — ABNORMAL LOW (ref 39.0–52.0)
Hemoglobin: 9.9 g/dL — ABNORMAL LOW (ref 13.0–17.0)
O2 Saturation: 98 %
Patient temperature: 98.6
Potassium: 3.4 mmol/L — ABNORMAL LOW (ref 3.5–5.1)
Sodium: 143 mmol/L (ref 135–145)
TCO2: 30 mmol/L (ref 22–32)
pCO2 arterial: 57.4 mmHg — ABNORMAL HIGH (ref 32.0–48.0)
pH, Arterial: 7.293 — ABNORMAL LOW (ref 7.350–7.450)
pO2, Arterial: 130 mmHg — ABNORMAL HIGH (ref 83.0–108.0)

## 2020-10-25 LAB — TROPONIN I (HIGH SENSITIVITY): Troponin I (High Sensitivity): 11 ng/L (ref <18)

## 2020-10-25 MED ORDER — LORAZEPAM 1 MG PO TABS: 1.0000 mg | ORAL_TABLET | ORAL | Status: DC | PRN

## 2020-10-25 MED ORDER — POTASSIUM CHLORIDE 10 MEQ/50ML IV SOLN: 10.0000 meq | INTRAVENOUS | Status: DC

## 2020-10-25 MED ORDER — DEXMEDETOMIDINE HCL IN NACL 400 MCG/100ML IV SOLN
0.4000 ug/kg/h | INTRAVENOUS | Status: DC
  Administered 2020-10-25: 0.4 ug/kg/h via INTRAVENOUS
  Filled 2020-10-25: qty 100

## 2020-10-25 MED ORDER — LACTATED RINGERS IV BOLUS
2000.0000 mL | Freq: Once | INTRAVENOUS | Status: AC
  Administered 2020-10-25: 1000 mL via INTRAVENOUS

## 2020-10-25 MED ORDER — ONDANSETRON HCL 4 MG PO TABS: 4.0000 mg | ORAL_TABLET | Freq: Four times a day (QID) | ORAL | Status: DC | PRN

## 2020-10-25 MED ORDER — DOCUSATE SODIUM 50 MG/5ML PO LIQD: 50.0000 mg | Freq: Two times a day (BID) | ORAL | Status: DC

## 2020-10-25 MED ORDER — HYDROMORPHONE HCL 1 MG/ML IJ SOLN
0.5000 mg | Freq: Once | INTRAMUSCULAR | Status: AC
  Administered 2020-10-25: 0.5 mg via INTRAVENOUS

## 2020-10-25 MED ORDER — EPINEPHRINE 0.1 MG/10ML (10 MCG/ML) SYRINGE FOR IV PUSH (FOR BLOOD PRESSURE SUPPORT): 5.0000 ug | PREFILLED_SYRINGE | Freq: Once | INTRAVENOUS | Status: DC | PRN

## 2020-10-25 MED ORDER — THIAMINE HCL 100 MG PO TABS: 100.0000 mg | ORAL_TABLET | Freq: Every day | ORAL | Status: DC

## 2020-10-25 MED ORDER — HYDROMORPHONE BOLUS VIA INFUSION
0.2000 mg | INTRAVENOUS | Status: DC | PRN
  Filled 2020-10-25: qty 1

## 2020-10-25 MED ORDER — PANTOPRAZOLE SODIUM 40 MG IV SOLR: 40.0000 mg | Freq: Every day | INTRAVENOUS | Status: DC

## 2020-10-25 MED ORDER — ACETAMINOPHEN 325 MG PO TABS: 650.0000 mg | ORAL_TABLET | Freq: Four times a day (QID) | ORAL | Status: DC

## 2020-10-25 MED ORDER — OXYBUTYNIN CHLORIDE 5 MG PO TABS
5.0000 mg | ORAL_TABLET | Freq: Three times a day (TID) | ORAL | Status: DC
  Filled 2020-10-25 (×2): qty 1

## 2020-10-25 MED ORDER — SODIUM CHLORIDE 0.9 % IV BOLUS
500.0000 mL | Freq: Once | INTRAVENOUS | Status: AC
  Administered 2020-10-25: 500 mL via INTRAVENOUS

## 2020-10-25 MED ORDER — LACTULOSE 10 GM/15ML PO SOLN
30.0000 g | Freq: Two times a day (BID) | ORAL | Status: DC
  Administered 2020-10-25: 30 g via ORAL
  Filled 2020-10-25: qty 45

## 2020-10-25 MED ORDER — FOLIC ACID 1 MG PO TABS: 1.0000 mg | ORAL_TABLET | Freq: Every day | ORAL | Status: DC

## 2020-10-25 MED ORDER — SODIUM CHLORIDE 0.9 % IV SOLN
0.5000 mg/h | INTRAVENOUS | Status: DC
  Administered 2020-10-25: 0.5 mg/h via INTRAVENOUS
  Filled 2020-10-25: qty 5

## 2020-10-25 MED ORDER — EPINEPHRINE HCL 5 MG/250ML IV SOLN IN NS
0.5000 ug/min | INTRAVENOUS | Status: DC
  Administered 2020-10-25: 30 ug/min via INTRAVENOUS

## 2020-10-25 MED ORDER — NOREPINEPHRINE 16 MG/250ML-% IV SOLN
0.0000 ug/min | INTRAVENOUS | Status: DC
  Administered 2020-10-25: 100 ug/min via INTRAVENOUS
  Administered 2020-10-25: 40 ug/min via INTRAVENOUS
  Administered 2020-10-25: 100 ug/min via INTRAVENOUS
  Filled 2020-10-25 (×4): qty 250

## 2020-10-25 MED ORDER — LORAZEPAM 2 MG/ML IJ SOLN: 1.0000 mg | INTRAMUSCULAR | Status: DC | PRN

## 2020-10-25 MED ORDER — NOREPINEPHRINE 4 MG/250ML-% IV SOLN
INTRAVENOUS | Status: AC
  Filled 2020-10-25: qty 250

## 2020-10-25 MED ORDER — POLYETHYLENE GLYCOL 3350 17 G PO PACK: 17.0000 g | PACK | Freq: Every day | ORAL | Status: DC

## 2020-10-25 MED ORDER — LACTULOSE 10 GM/15ML PO SOLN: 30.0000 g | Freq: Two times a day (BID) | ORAL | Status: DC

## 2020-10-25 MED ORDER — EPINEPHRINE HCL 5 MG/250ML IV SOLN IN NS
INTRAVENOUS | Status: AC
  Filled 2020-10-25: qty 250

## 2020-10-25 MED ORDER — INSULIN ASPART 100 UNIT/ML ~~LOC~~ SOLN: 0.0000 [IU] | SUBCUTANEOUS | Status: DC

## 2020-10-25 MED ORDER — LORAZEPAM 2 MG/ML IJ SOLN
1.0000 mg | INTRAMUSCULAR | Status: DC | PRN
  Filled 2020-10-25: qty 1

## 2020-10-25 MED ORDER — VASOPRESSIN 20 UNITS/100 ML INFUSION FOR SHOCK
0.0000 [IU]/min | INTRAVENOUS | Status: DC
  Administered 2020-10-25: 0.04 [IU]/min via INTRAVENOUS
  Filled 2020-10-25 (×2): qty 100

## 2020-10-29 MED FILL — Medication: Qty: 1 | Status: AC
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
## 2020-11-11 NOTE — Progress Notes (Signed)
RT note-ETT placement confirmed with xray and withdrawn 4cm per Dr. Katrinka Blazing. Xray repeated and now in good placement.

## 2020-11-11 NOTE — Procedures (Signed)
Insertion of Chest Tube Procedure Note  Ernest Lawrence  [id number]  December 22, 1945  Date:11-14-2020  Time:2:14 PM    Provider Performing: Lorin Glass   Procedure: Chest Tube Insertion [id number])  Indication(s) Pneumothorax  Consent Unable to obtain consent due to emergent nature of procedure.  Anesthesia Topical only with 1% lidocaine    Time Out Verified patient identification, verified procedure, site/side was marked, verified correct patient position, special equipment/implants available, medications/allergies/relevant history reviewed, required imaging and test results available.   Sterile Technique Maximal sterile technique including full sterile barrier drape, hand hygiene, sterile gown, sterile gloves, mask, hair covering, sterile ultrasound probe cover (if used).   Procedure Description Ultrasound not used to identify appropriate pleural anatomy for placement and overlying skin marked. Area of placement cleaned and draped in sterile fashion.  A 32 French chest tube was placed into the left pleural space using Kelly dissection. Appropriate return of fluid was obtained.  The tube was connected to atrium and placed on -20 cm H2O wall suction.   Complications/Tolerance None; patient tolerated the procedure well. Chest X-ray is ordered to verify placement.   EBL Minimal  Specimen(s) none
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
## 2020-11-11 NOTE — Progress Notes (Signed)
12:33 pm. CCMD reported saturations high 80`s. Upon arrival to the patient room, sats rapidly dropped to 60`s  And patient became unresponsive with no pulse. Code blue called and resuscitated per ACLS protocol. Dr Andrey Campanile informed and was present. Patient transferred to 4NICU intubated. Patient sister was called by charge nurse and updated.
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
## 2020-11-11 NOTE — Procedures (Signed)
Arterial Catheter Insertion Procedure Note  Joanthony Hamza  [id number]  March 27, 1946  Date:11/01/2020  Time: 12:00 PM   Provider Performing: Lannette Donath    Procedure: Insertion of Arterial Line (85277) with US guidance (82423)   Indication(s) Blood pressure monitoring and/or need for frequent ABGs  Consent Unable to obtain consent due to emergent nature of procedure.  Anesthesia None   Time Out Verified patient identification, verified procedure, site/side was marked, verified correct patient position, special equipment/implants available, medications/allergies/relevant history reviewed, required imaging and test results available.   Sterile Technique Maximal sterile technique including full sterile barrier drape, hand hygiene, sterile gown, sterile gloves, mask, hair covering, sterile ultrasound probe cover (if used).   Procedure Description Area of catheter insertion was cleaned with chlorhexidine and draped in sterile fashion. With real-time ultrasound guidance an arterial catheter was placed into the right femoral artery.  Appropriate arterial tracings confirmed on monitor.     Complications/Tolerance None; patient tolerated the procedure well.   EBL Minimal   Specimen(s) None

## 2020-11-11 NOTE — Procedures (Signed)
Central Venous Catheter Insertion Procedure Note  Ernest Lawrence  [id number]  02-Dec-1946  Date:11-21-2020  Time: 12:00 PM  Provider Performing:Avaleigh Decuir Barton Fanny   Procedure: Insertion of Non-tunneled Central Venous Catheter(36556) with US guidance (34287)   Indication(s) Medication administration and Difficult access  Consent Unable to obtain consent due to emergent nature of procedure.  Anesthesia None  Timeout Verified patient identification, verified procedure, site/side was marked, verified correct patient position, special equipment/implants available, medications/allergies/relevant history reviewed, required imaging and test results available.  Sterile Technique Maximal sterile technique including full sterile barrier drape, hand hygiene, sterile gown, sterile gloves, mask, hair covering, sterile ultrasound probe cover (if used).  Procedure Description Area of catheter insertion was cleaned with chlorhexidine and draped in sterile fashion.  With real-time ultrasound guidance a central venous catheter was placed into the right femoral vein. Nonpulsatile blood flow and easy flushing noted in all ports.  The catheter was sutured in place and sterile dressing applied.  Complications/Tolerance None; patient tolerated the procedure well. Chest X-ray is ordered to verify placement for internal jugular or subclavian cannulation.   Chest x-ray is not ordered for femoral cannulation.  EBL Minimal  Specimen(s) None
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
## 2020-11-11 NOTE — Accreditation Note (Signed)
Restraints reported to CMS  Pursuant to regulation 482.13 (G) (3) use of restraints was logged and CMS was notified via CMS electronic portal on 10/27/2020 1715  by Laurene Footman RN Accreditation Coordinator.

## 2020-11-11 NOTE — Progress Notes (Addendum)
Patient was terminally extubated at 18:05 to room air per MD order with RN at the bedside.

## 2020-11-11 NOTE — Progress Notes (Addendum)
Patient  is very unconfortable with foley catheter, irrigated with 120cc and 450cc bloody urine with clots came out. bladder scan shows in bladder. He continues to say " i cant pee" Also noted blood clots coming from his penis. Trixie Deis PA made aware. Will order DG Cystogram.

## 2020-11-11 NOTE — Progress Notes (Signed)
Patient arrived from 4NP post CPR with ROSC achieved; intubated with code team present.   Femoral triple lumen central line and art line inserted by Resident under sterile technique.   The following medications given per D. Smith MD:  Epi 0.5mg  @ 1309 Epi 0.5mg  @ 1315 Atropine 1mg  @ 1316 Sodium bicarb @ 1318 Calcium cl 1mg  @ 1319 Epi 1mg  @ 1340 Epi 1mg  @ 1348  See MAR for any other medications.   Family notified by D. Smith MD immediately come to hospital.

## 2020-11-11 NOTE — Code Documentation (Signed)
  Patient Name: Ernest Lawrence   MRN: [id number]   Date of Birth/ Sex: 04-Mar-1946 , male      Admission Date: 10/20/2020  Attending Provider: Md, Trauma, MD  Primary Diagnosis: <principal problem not specified>   Indication: Pt was in his usual state of health until this PM, when he was noted to be unresponsive. Code blue was subsequently called. At the time of arrival on scene, ACLS protocol was underway.   Technical Description:  - CPR performance duration:  15 minutes  - Was defibrillation or cardioversion used? No   - Was external pacer placed? Yes  - Was patient intubated pre/post CPR? Yes   Medications Administered: Y = Yes; Blank = No Amiodarone    Atropine    Calcium    Epinephrine  Y  Lidocaine    Magnesium    Norepinephrine    Phenylephrine    Sodium bicarbonate  Y  Vasopressin     Post CPR evaluation:  - Final Status - Was patient successfully resuscitated ? Yes - What is current rhythm? Sinus tachycardia - What is current hemodynamic status? Stable   Miscellaneous Information:  - Labs sent, including: CBC, BMP, Lactic Acid  - Primary team notified?  Yes  - Family Notified? Yes  - Additional notes/ transfer status: Transferred to 4N25 and PCCM service consulted for further management      Eliezer Bottom, MD  11/10/2020, 1:40 PM
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
## 2020-11-11 DEATH — deceased
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
